# Patient Record
Sex: Female | Born: 1965 | Race: White | Hispanic: No | Smoking: Current every day smoker
Health system: Southern US, Community
[De-identification: ages and names within clinical notes are randomized; demographics above are authoritative.]

## PROBLEM LIST (undated history)

## (undated) DIAGNOSIS — C801 Malignant (primary) neoplasm, unspecified: Secondary | ICD-10-CM

## (undated) HISTORY — PX: TONSILLECTOMY: SUR1361

## (undated) HISTORY — PX: BREAST LUMPECTOMY: SHX2

## (undated) HISTORY — PX: ABDOMINAL SURGERY: SHX537

## (undated) HISTORY — PX: APPENDECTOMY: SHX54

## (undated) HISTORY — PX: CERVICAL FUSION: SHX112

---

## 2008-01-03 ENCOUNTER — Emergency Department (HOSPITAL_COMMUNITY): Admission: EM | Admit: 2008-01-03 | Discharge: 2008-01-04 | Payer: Self-pay | Admitting: Family Medicine

## 2008-01-16 ENCOUNTER — Emergency Department (HOSPITAL_COMMUNITY): Admission: EM | Admit: 2008-01-16 | Discharge: 2008-01-16 | Payer: Self-pay | Admitting: Emergency Medicine

## 2008-02-09 ENCOUNTER — Ambulatory Visit (HOSPITAL_COMMUNITY): Admission: RE | Admit: 2008-02-09 | Discharge: 2008-02-09 | Payer: Self-pay | Admitting: Family Medicine

## 2008-04-26 ENCOUNTER — Emergency Department (HOSPITAL_COMMUNITY): Admission: EM | Admit: 2008-04-26 | Discharge: 2008-04-26 | Payer: Self-pay | Admitting: Emergency Medicine

## 2008-05-15 ENCOUNTER — Emergency Department (HOSPITAL_COMMUNITY): Admission: EM | Admit: 2008-05-15 | Discharge: 2008-05-15 | Payer: Self-pay | Admitting: Emergency Medicine

## 2008-05-18 ENCOUNTER — Ambulatory Visit (HOSPITAL_COMMUNITY): Admission: RE | Admit: 2008-05-18 | Discharge: 2008-05-18 | Payer: Self-pay | Admitting: Emergency Medicine

## 2008-06-01 ENCOUNTER — Ambulatory Visit (HOSPITAL_COMMUNITY): Admission: RE | Admit: 2008-06-01 | Discharge: 2008-06-01 | Payer: Self-pay | Admitting: Sports Medicine

## 2009-08-13 IMAGING — CR DG HIP COMPLETE 2+V*R*
3 series · 3 of 3 positions shown · non-contrast
Comparison: None

CLINICAL DATA: Right hip pain

RIGHT HIP - COMPLETE 2+ VIEW

[view not recorded (1 of 3)]
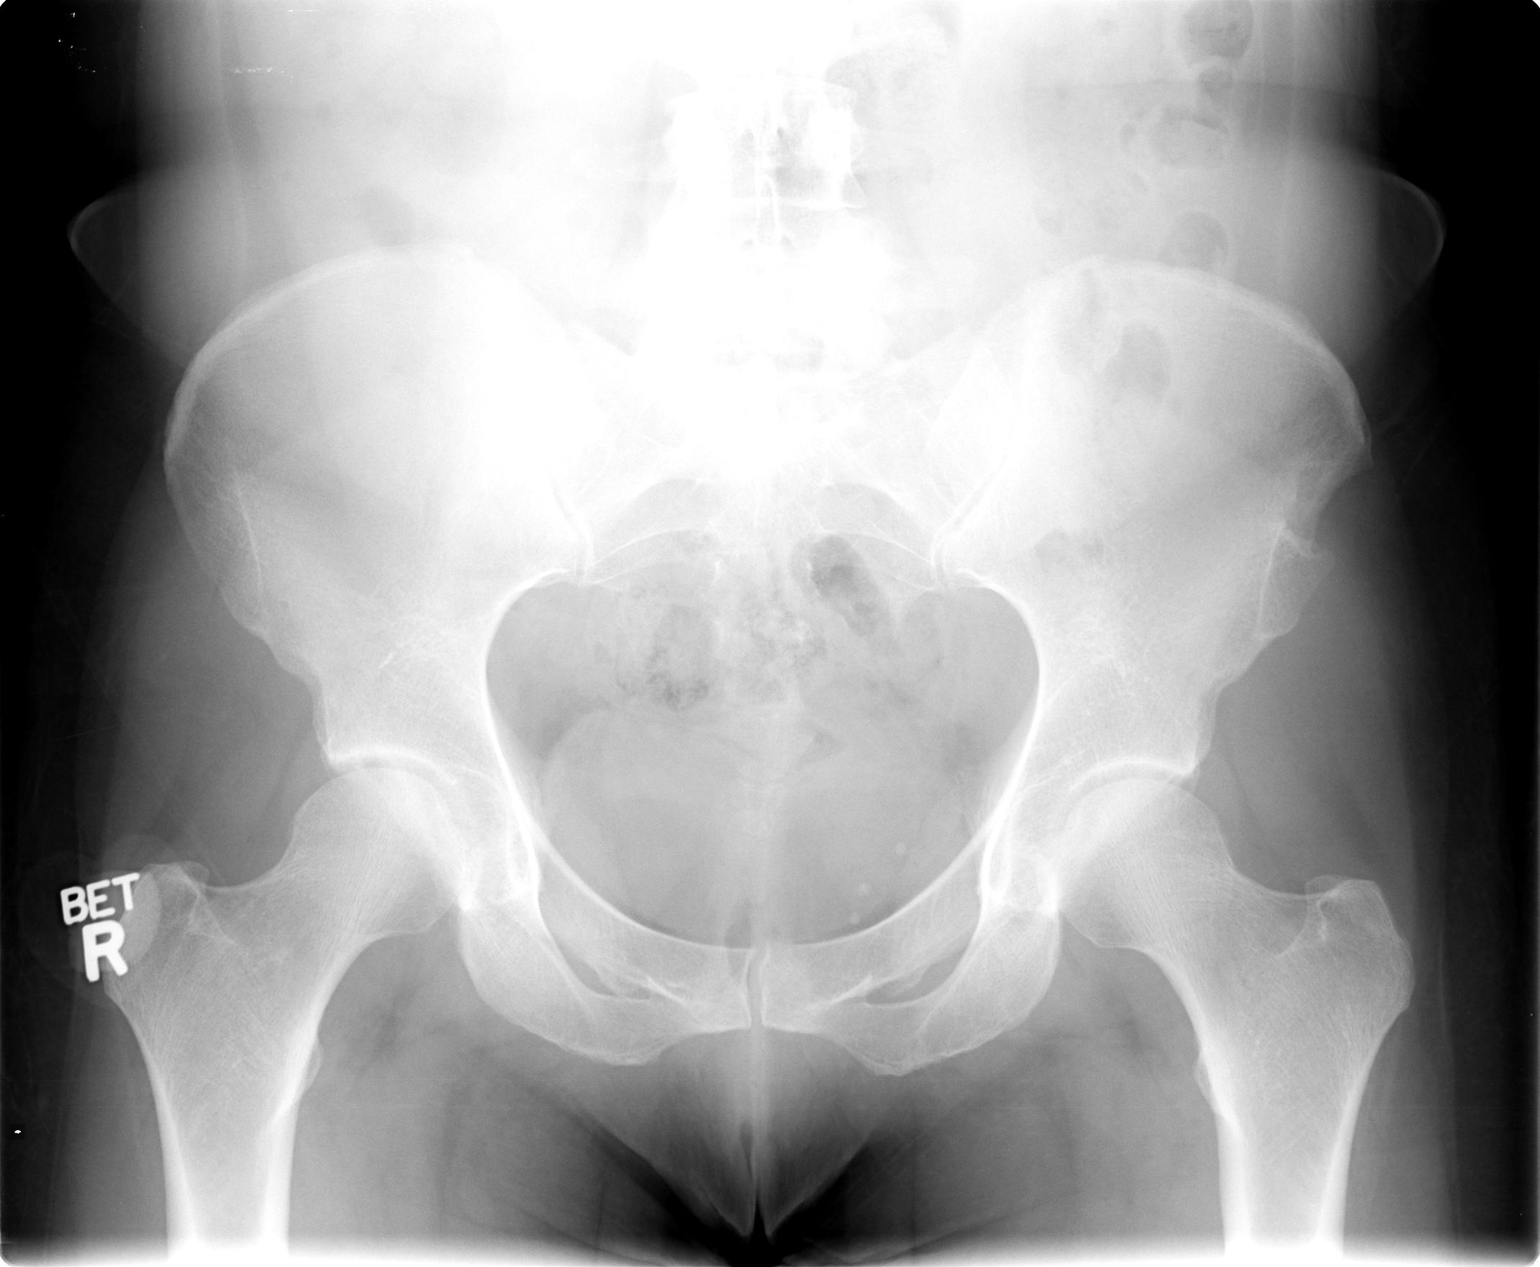

[view not recorded (2 of 3)]
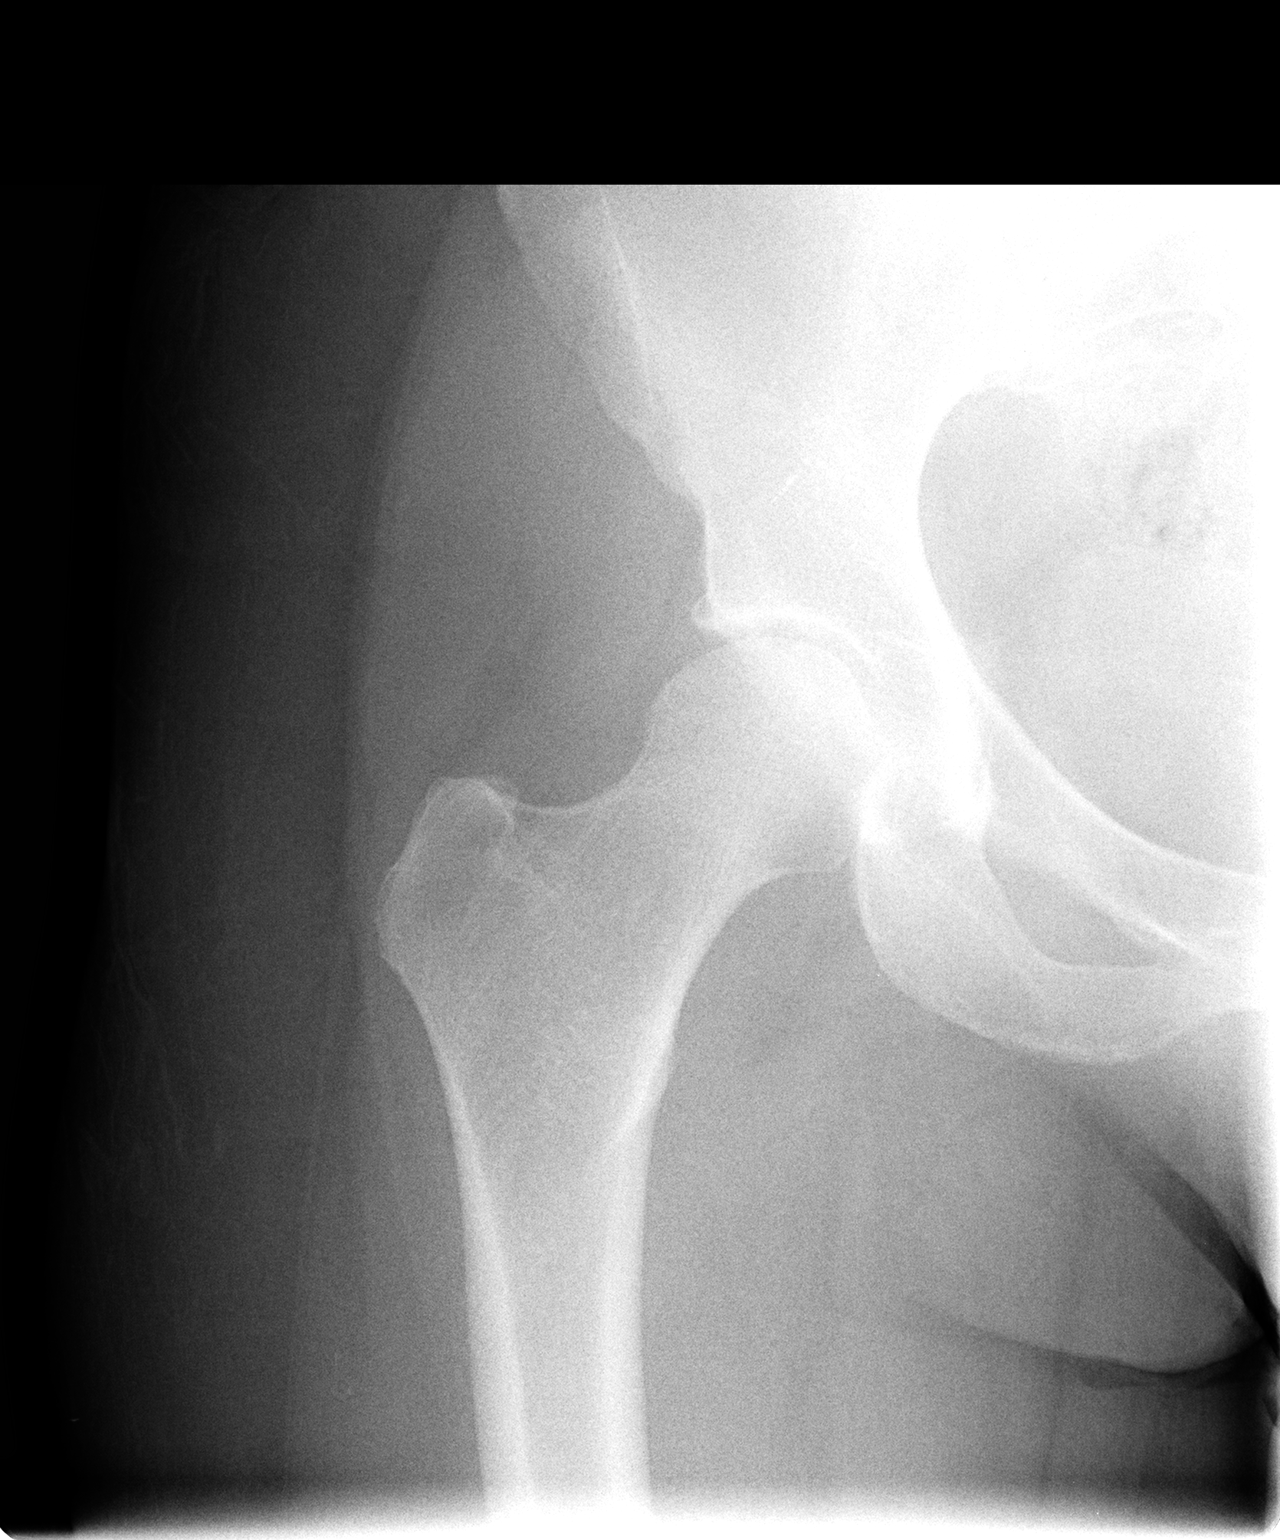

[view not recorded (3 of 3)]
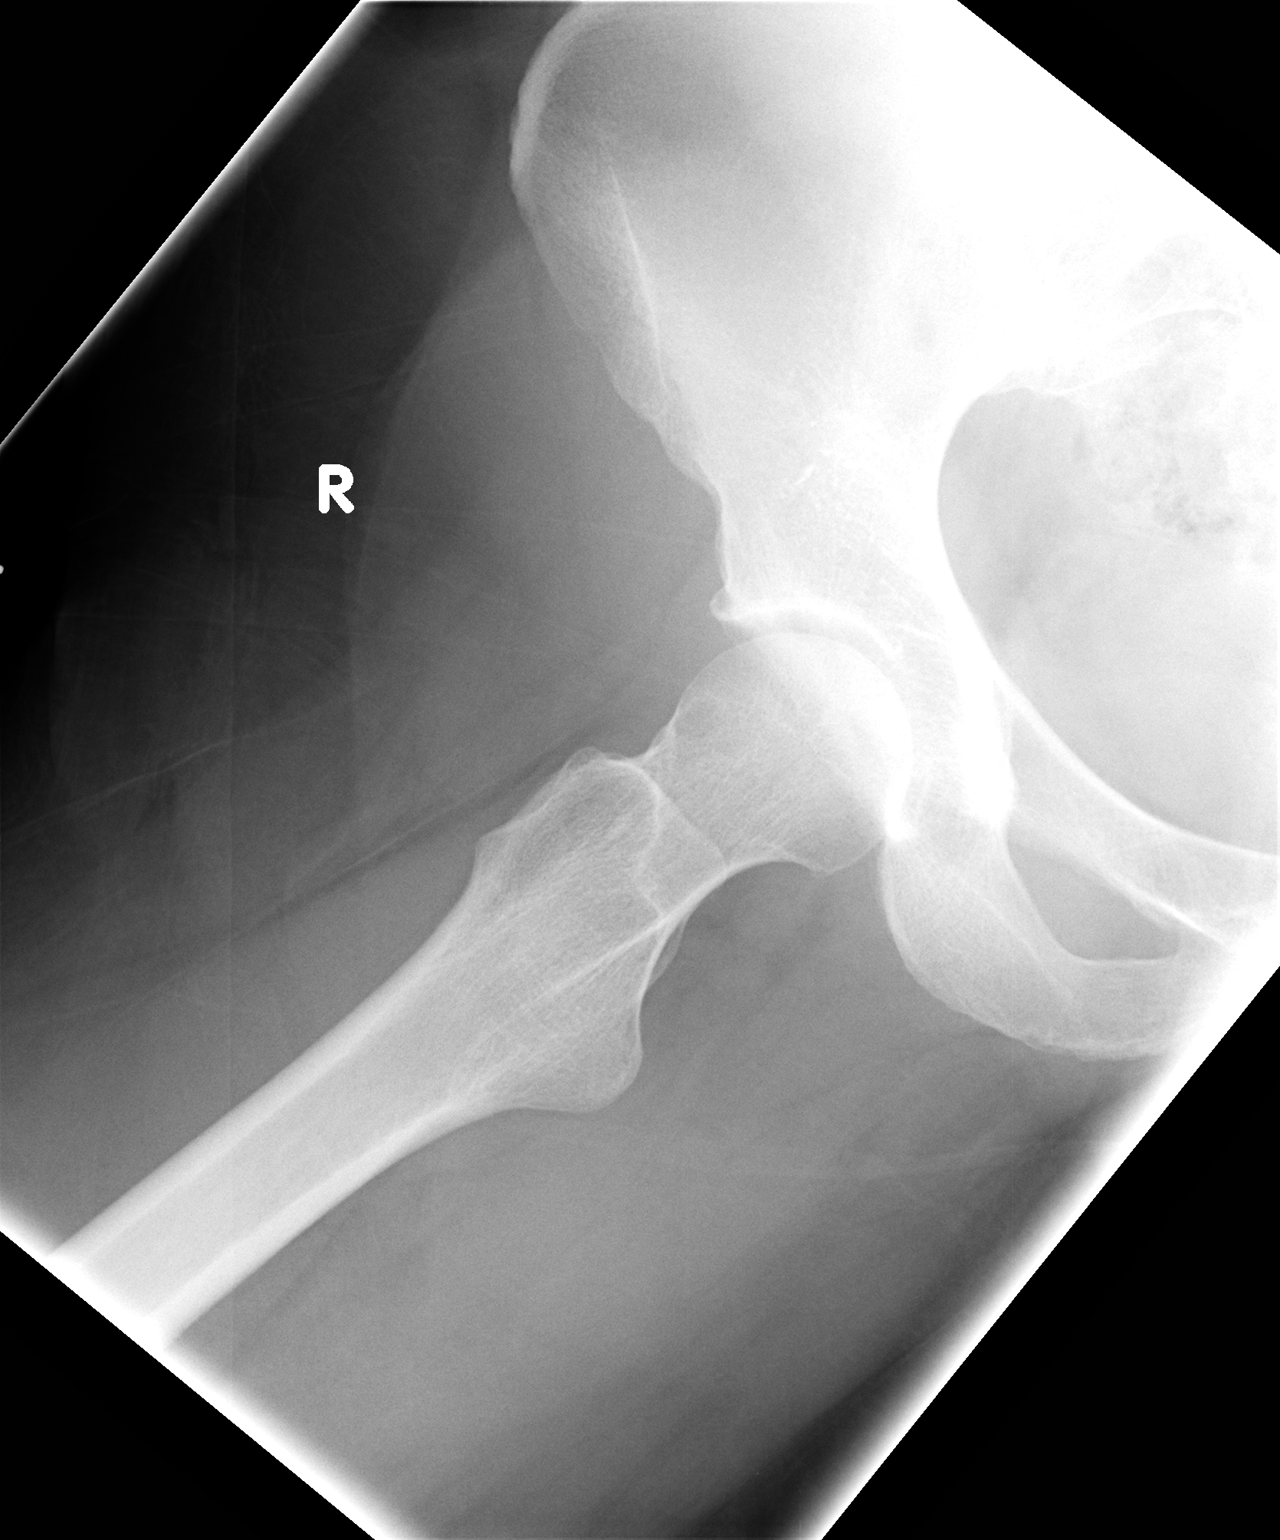

[3 of 3 positions shown; findings below may reference images not displayed]

FINDINGS: Hip joint spaces symmetric and preserved.
SI joints symmetric.
No fracture, dislocation, or bone destruction.
Small left pelvic phleboliths.
IMPRESSION: No acute bony abnormalities.

## 2009-09-04 ENCOUNTER — Emergency Department: Payer: Self-pay | Admitting: Emergency Medicine

## 2009-09-11 ENCOUNTER — Ambulatory Visit: Payer: Self-pay | Admitting: Internal Medicine

## 2010-04-13 ENCOUNTER — Encounter: Payer: Self-pay | Admitting: Emergency Medicine

## 2010-06-29 ENCOUNTER — Emergency Department: Payer: Self-pay | Admitting: Emergency Medicine

## 2010-07-08 LAB — BASIC METABOLIC PANEL
CO2: 25 mEq/L (ref 19–32)
Creatinine, Ser: 0.69 mg/dL (ref 0.4–1.2)
Glucose, Bld: 93 mg/dL (ref 70–99)
Sodium: 141 mEq/L (ref 135–145)

## 2010-07-08 LAB — DIFFERENTIAL
Basophils Absolute: 0 10*3/uL (ref 0.0–0.1)
Basophils Relative: 0 % (ref 0–1)
Eosinophils Absolute: 0.2 10*3/uL (ref 0.0–0.7)
Eosinophils Absolute: 0.1 10*3/uL (ref 0.0–0.7)
Eosinophils Relative: 4 % (ref 0–5)
Eosinophils Relative: 2 % (ref 0–5)
Lymphs Abs: 2 10*3/uL (ref 0.7–4.0)
Monocytes Absolute: 0.8 10*3/uL (ref 0.1–1.0)
Monocytes Absolute: 0.6 10*3/uL (ref 0.1–1.0)
Neutro Abs: 3.5 10*3/uL (ref 1.7–7.7)
Neutrophils Relative %: 46 % (ref 43–77)
Neutrophils Relative %: 56 % (ref 43–77)

## 2010-07-08 LAB — CBC
HCT: 46.8 % — ABNORMAL HIGH (ref 36.0–46.0)
HCT: 41.3 % (ref 36.0–46.0)
Hemoglobin: 16.5 g/dL — ABNORMAL HIGH (ref 12.0–15.0)
Hemoglobin: 14.8 g/dL (ref 12.0–15.0)
MCHC: 35.1 g/dL (ref 30.0–36.0)
MCV: 93.5 fL (ref 78.0–100.0)
RBC: 4.42 MIL/uL (ref 3.87–5.11)
RDW: 12.6 % (ref 11.5–15.5)
WBC: 5.1 10*3/uL (ref 4.0–10.5)
WBC: 6.3 10*3/uL (ref 4.0–10.5)

## 2010-07-08 LAB — COMPREHENSIVE METABOLIC PANEL
AST: 21 U/L (ref 0–37)
BUN: 13 mg/dL (ref 6–23)
CO2: 25 mEq/L (ref 19–32)
Calcium: 8.9 mg/dL (ref 8.4–10.5)
GFR calc non Af Amer: >60 mL/min (ref >60)
Total Bilirubin: 0.6 mg/dL (ref 0.3–1.2)

## 2011-12-07 ENCOUNTER — Ambulatory Visit: Payer: Self-pay | Admitting: Family Medicine

## 2014-06-13 ENCOUNTER — Emergency Department: Payer: Self-pay | Admitting: Student

## 2014-10-21 ENCOUNTER — Encounter: Payer: Self-pay | Admitting: *Deleted

## 2014-10-21 DIAGNOSIS — Z91040 Latex allergy status: Secondary | ICD-10-CM | POA: Insufficient documentation

## 2014-10-21 DIAGNOSIS — G8929 Other chronic pain: Secondary | ICD-10-CM | POA: Insufficient documentation

## 2014-10-21 DIAGNOSIS — H538 Other visual disturbances: Secondary | ICD-10-CM | POA: Diagnosis not present

## 2014-10-21 DIAGNOSIS — Z720 Tobacco use: Secondary | ICD-10-CM | POA: Diagnosis not present

## 2014-10-21 DIAGNOSIS — G911 Obstructive hydrocephalus: Secondary | ICD-10-CM | POA: Insufficient documentation

## 2014-10-21 DIAGNOSIS — R51 Headache: Secondary | ICD-10-CM | POA: Diagnosis present

## 2014-10-21 DIAGNOSIS — G9389 Other specified disorders of brain: Secondary | ICD-10-CM | POA: Diagnosis not present

## 2014-10-21 DIAGNOSIS — R0602 Shortness of breath: Secondary | ICD-10-CM | POA: Insufficient documentation

## 2014-10-21 NOTE — ED Notes (Signed)
Pt c/o headache x 1 month, worsening over the course of time. Pt states pain getting progressively worse. Pt states movement and coughing make pain worse. Pt c/o nausea, denies vomiting. Pt states she has had visual changes x 10 days ago. Pt states fioricet and excedrin migraine no longer working to decrease pain.

## 2014-10-22 ENCOUNTER — Emergency Department: Payer: Medicaid Other

## 2014-10-22 ENCOUNTER — Emergency Department
Admission: EM | Admit: 2014-10-22 | Discharge: 2014-10-22 | Disposition: A | Discharge status: Other Acute Inpatient Hospital | Payer: Medicaid Other | Attending: Emergency Medicine | Admitting: Emergency Medicine

## 2014-10-22 DIAGNOSIS — R519 Headache, unspecified: Secondary | ICD-10-CM

## 2014-10-22 DIAGNOSIS — G911 Obstructive hydrocephalus: Secondary | ICD-10-CM

## 2014-10-22 DIAGNOSIS — G9389 Other specified disorders of brain: Secondary | ICD-10-CM

## 2014-10-22 DIAGNOSIS — R51 Headache: Secondary | ICD-10-CM

## 2014-10-22 HISTORY — DX: Malignant (primary) neoplasm, unspecified: C80.1

## 2014-10-22 LAB — BASIC METABOLIC PANEL
Anion gap: 7 (ref 5–15)
BUN: 14 mg/dL (ref 6–20)
CO2: 28 mmol/L (ref 22–32)
Calcium: 9.5 mg/dL (ref 8.9–10.3)
Chloride: 104 mmol/L (ref 101–111)
Creatinine, Ser: 0.65 mg/dL (ref 0.44–1.00)
GFR calc non Af Amer: >60 mL/min (ref >60)
GLUCOSE: 108 mg/dL — AB (ref 65–99)
Potassium: 3.4 mmol/L — ABNORMAL LOW (ref 3.5–5.1)
Sodium: 139 mmol/L (ref 135–145)

## 2014-10-22 LAB — CBC
HCT: 46.1 % (ref 35.0–47.0)
HEMOGLOBIN: 15.8 g/dL (ref 12.0–16.0)
MCH: 33.3 pg (ref 26.0–34.0)
MCHC: 34.3 g/dL (ref 32.0–36.0)
MCV: 97 fL (ref 80.0–100.0)
PLATELETS: 298 10*3/uL (ref 150–440)
RBC: 4.75 MIL/uL (ref 3.80–5.20)
RDW: 12.5 % (ref 11.5–14.5)
WBC: 8.6 10*3/uL (ref 3.6–11.0)

## 2014-10-22 MED ORDER — DEXAMETHASONE SODIUM PHOSPHATE 10 MG/ML IJ SOLN
INTRAMUSCULAR | Status: AC
  Administered 2014-10-22: 10 mg via INTRAVENOUS
  Filled 2014-10-22: qty 1

## 2014-10-22 MED ORDER — SODIUM CHLORIDE 0.9 % IV BOLUS (SEPSIS)
1000.0000 mL | Freq: Once | INTRAVENOUS | Status: AC
  Administered 2014-10-22: 1000 mL via INTRAVENOUS

## 2014-10-22 MED ORDER — DIPHENHYDRAMINE HCL 50 MG/ML IJ SOLN
25.0000 mg | Freq: Once | INTRAMUSCULAR | Status: DC
  Filled 2014-10-22: qty 1

## 2014-10-22 MED ORDER — METOCLOPRAMIDE HCL 5 MG/ML IJ SOLN
10.0000 mg | Freq: Once | INTRAMUSCULAR | Status: AC
  Administered 2014-10-22: 10 mg via INTRAVENOUS
  Filled 2014-10-22: qty 2

## 2014-10-22 MED ORDER — DEXAMETHASONE SODIUM PHOSPHATE 10 MG/ML IJ SOLN
10.0000 mg | Freq: Once | INTRAMUSCULAR | Status: AC
  Administered 2014-10-22: 10 mg via INTRAVENOUS

## 2014-10-22 MED ORDER — METHYLPREDNISOLONE SODIUM SUCC 125 MG IJ SOLR
125.0000 mg | Freq: Once | INTRAMUSCULAR | Status: DC
  Filled 2014-10-22: qty 2

## 2014-10-22 NOTE — ED Notes (Signed)
Pt left via EMS to be transferred to [facility]

## 2014-10-22 NOTE — ED Notes (Signed)
Pt flushed after dexamethosone "it's burning everywhere" Dr Dahlia Client informed, benadryl ordered but pt refused d/t symptoms subsiding

## 2014-10-22 NOTE — ED Provider Notes (Signed)
[facility] Emergency Department Provider Note  ____________________________________________  Time seen: Approximately 158 AM  I have reviewed the triage vital signs and the nursing notes.   HISTORY  Chief Complaint Headache    HPI Elizabeth Bright is a 49 y.o. female who come into the hospital tonight with a headache for 1 month. The patient reports that she has been to her primary care physician 3 times in the past month. The patient's physician has been attempting to get the patient an MRI but has been refused by her insurance company. The patient reports that she has been taking Excedrin to help but the pain keeps coming back. She reports that the headache is worse when she coughs or moves her head or bends over. The patient reports that it is there all of the time. She has had some blurred vision and reports that recently she has been feeling as though she is drunk. The patient has not been referred to a neurologist and has not had any other imaging of her head. The patient reports that she's never had headaches before. She has been feeling nauseous and dizzy as well. She's had some mild shortness of breath. Her headache is a 7-8 out of 10 in intensity. The patient reports that she came in tonight because she is scared.   Past Medical History  Diagnosis Date  . Cancer     There are no active problems to display for this patient.   Past Surgical History  Procedure Laterality Date  . Breast lumpectomy Right   . Abdominal surgery    . Cervical fusion      C5-6  . Tonsillectomy    . Appendectomy      Current Outpatient Rx  Name  Route  Sig  Dispense  Refill  . albuterol (PROVENTIL HFA;VENTOLIN HFA) 108 (90 BASE) MCG/ACT inhaler   Inhalation   Inhale 2 puffs into the lungs every 6 (six) hours as needed for wheezing or shortness of breath.         . butalbital-acetaminophen-caffeine (FIORICET, ESGIC) 50-325-40 MG per tablet   Oral   Take 1 tablet by  mouth every 6 (six) hours as needed for headache.           Allergies Latex  History reviewed. No pertinent family history.  Social History History  Substance Use Topics  . Smoking status: Current Every Day Smoker    Types: Cigarettes  . Smokeless tobacco: Never Used  . Alcohol Use: Yes     Comment: occasional beer    Review of Systems Constitutional: No fever/chills Eyes: Blurred vision ENT: No sore throat. Cardiovascular: Denies chest pain. Respiratory: Shortness of breath Gastrointestinal: Nausea with No abdominal pain, no vomiting.  No diarrhea.  No constipation. Genitourinary: Negative for dysuria. Musculoskeletal: Negative for back pain. Skin: Negative for rash. Neurological: Headache  10-point ROS otherwise negative.  ____________________________________________   PHYSICAL EXAM:  VITAL SIGNS: ED Triage Vitals  Enc Vitals Group     BP 10/21/14 2139 144/95 mmHg     Pulse Rate 10/21/14 2139 94     Resp 10/21/14 2139 20     Temp 10/21/14 2139 98 F (36.7 C)     Temp Source 10/21/14 2139 Oral     SpO2 10/21/14 2139 96 %     Weight 10/22/14 0130 123 lb (55.792 kg)     Height 10/22/14 0130 5\' 1"  (1.549 m)     Head Cir --      Peak Flow --  Pain Score 10/21/14 2140 10     Pain Loc --      Pain Edu? --      Excl. in New Home? --     Constitutional: Alert and oriented. Well appearing and in mild distress. Eyes: Conjunctivae are normal. PERRL. EOMI. horizontal nystagmus Head: Atraumatic. Nose: No congestion/rhinnorhea. Mouth/Throat: Mucous membranes are moist.  Oropharynx non-erythematous. Cardiovascular: Normal rate, regular rhythm. Grossly normal heart sounds.  Good peripheral circulation. Respiratory: Normal respiratory effort.  No retractions. Lungs CTAB. Gastrointestinal: Soft and nontender. No distention. Positive bowel sounds Genitourinary: Deferred Musculoskeletal: No lower extremity tenderness nor edema.  No joint effusions. Neurologic:  Normal  speech and language. No gross focal neurologic deficits are appreciated. No gait instability. Skin:  Skin is warm, dry and intact. No rash noted. Psychiatric: Mood and affect are normal.   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 MRI without contrast is recommended. There may be a second mass in the pineal region ____________________________________________   PROCEDURES  Procedure(s) performed: None  Critical Care performed: No  ____________________________________________   INITIAL IMPRESSION / ASSESSMENT AND PLAN / ED COURSE  Pertinent labs & imaging results that were available during my care of the patient were reviewed by me and considered in my medical decision making (see chart for details).  This is a 49 year old female who comes in today with a headache for one month. The patient's headache is likely due to the mass found on the CT scan. The patient does have a history of breast cancer so there is a concern that this may be metastatic. I did give the patient a dose of Reglan for nausea as well as a dose of Decadron 10 mg IV for hydrocephalus. The patient will be transferred to [facility] since she does have some obstructive hydrocephalus seen on the CT scan. I discussed this with the patient and her significant other. The patient was very emotionally upset but I informed her that she will find out more information about this mass at  [facility].  I contacted the [facility] transfer center and the patient was accepted in transfer to [facility] for further evaluation and treatment of her headache and hydrocephalus due to a cerebellar mass. ____________________________________________   FINAL CLINICAL IMPRESSION(S) / ED DIAGNOSES  Final diagnoses:  Cerebellar mass  Obstructive hydrocephalus  Chronic intractable headache, unspecified headache type      Loney Hering, MD 10/22/14 [phone]

## 2014-10-22 NOTE — ED Notes (Signed)
Pt states HA above eyes for last month; worse with movement, light or straining on toilet.  Pt has hx breast CA (2008) with chemo and Rad, and recent new lump right breast.  Pt anxious about insurance coverage.

## 2014-10-22 NOTE — Progress Notes (Addendum)
   10/22/14 0300  Clinical Encounter Type  Visited With Patient and family together  Visit Type Spiritual support  Referral From Nurse  Spiritual Encounters  Spiritual Needs Grief support  Stress Factors  Patient Stress Factors Health changes  Family Stress Factors None identified   Status: grieving, 49 yr female Family: Fiance bedside, the patient shared that he is sometimes supportive Faith: Nondenom Visit Assessment: The chaplain gave encouraging words to the patient and her fiance and sat with the patient until the fiance returned from coffee break. The patient shared that she'll be transf to [facility] after learning that she has a mass on her brain. She shared that her sister just passed and that she does not have children. She confessed that she is afraid and does not want to die. The patient's grief, healing and comfort, will be lifted up in prayer. The visit concluded with a hug.  Pastoral care: [phone] pager or orders can be submitted online

## 2016-02-21 DEATH — deceased

## 2016-05-19 IMAGING — CT CT HEAD W/O CM
1 series · 15 of 29 positions shown, 19 images · non-contrast
Comparison: None.

CLINICAL DATA: Frontal headache for 1 month.  Photophobia.

EXAM:
CT HEAD WITHOUT CONTRAST
TECHNIQUE: Contiguous axial images were obtained from the base of the skull
through the vertex without intravenous contrast.

[Series 2: soft tissue · axial · 0.38mm/px · z∈[-86,+44]mm · 15 of 29 slices shown, 19 images]
[im 2/29  brain]
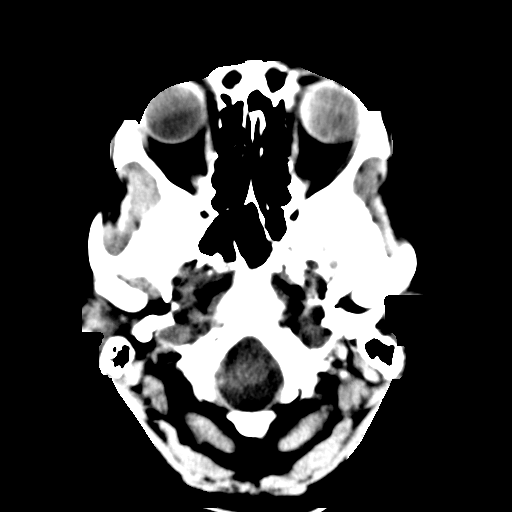
[im 2/29  bone]
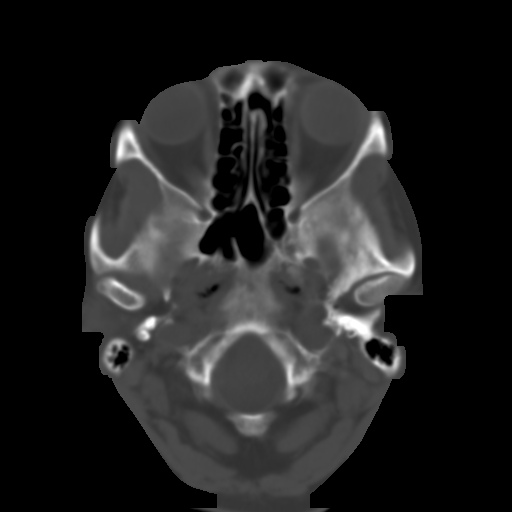
[im 4/29  brain]
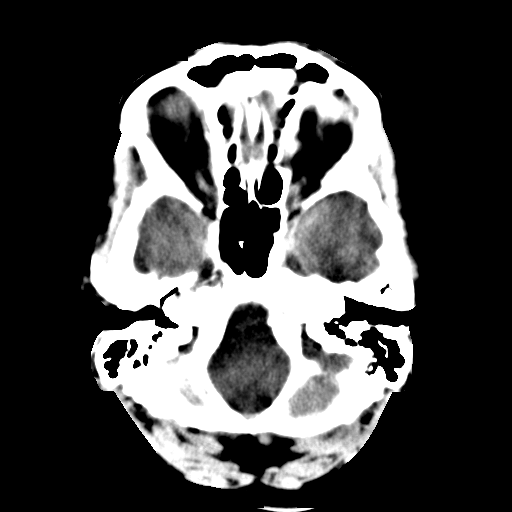
[im 6/29  brain]
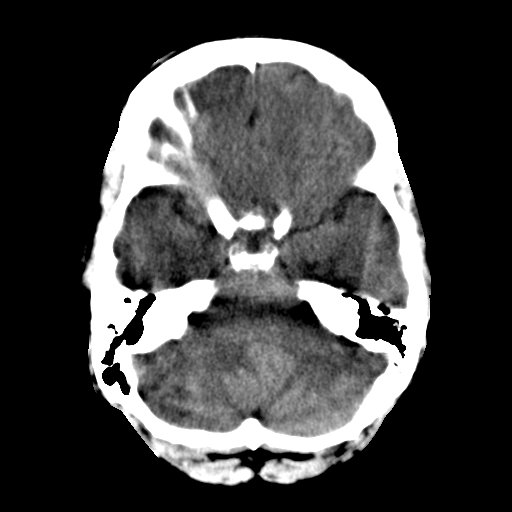
[im 8/29  brain]
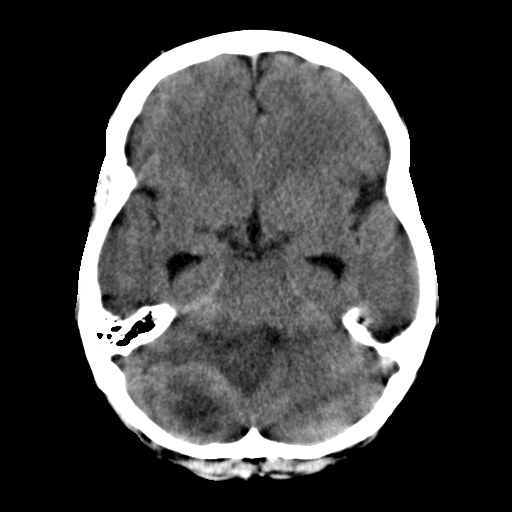
[im 10/29  brain]
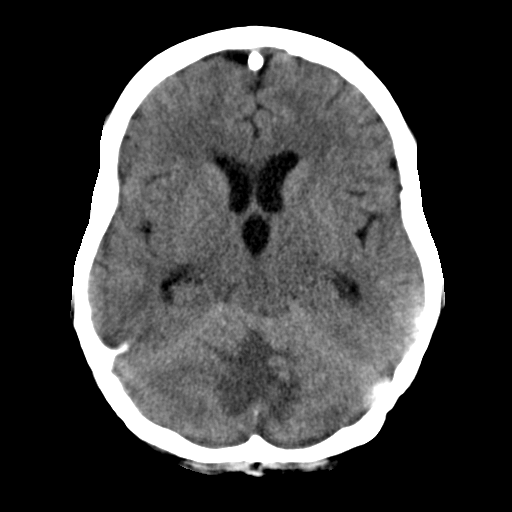
[im 10/29  bone]
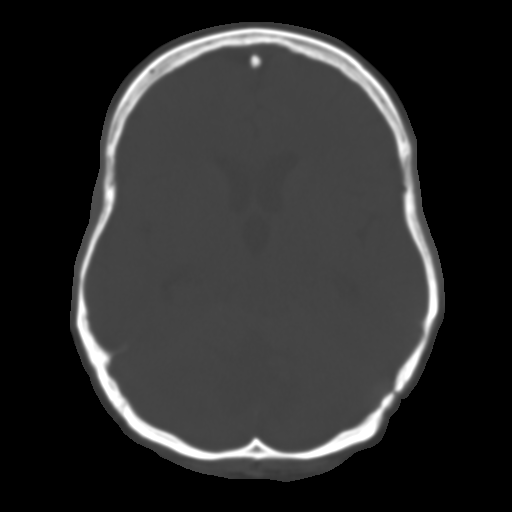
[im 11/29  brain]
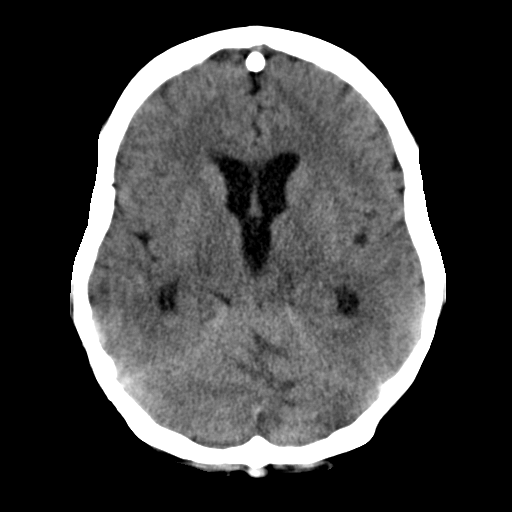
[im 13/29  brain]
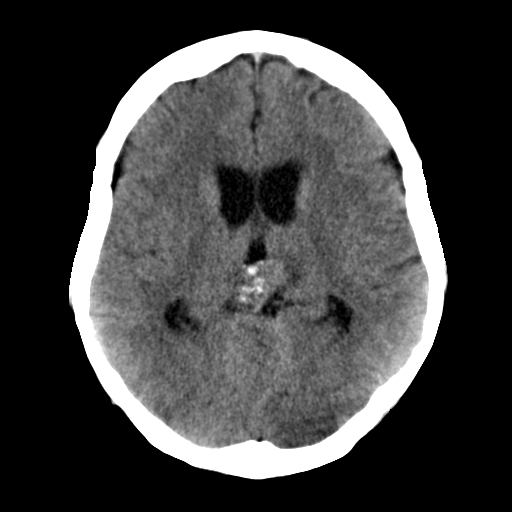
[im 15/29  brain]
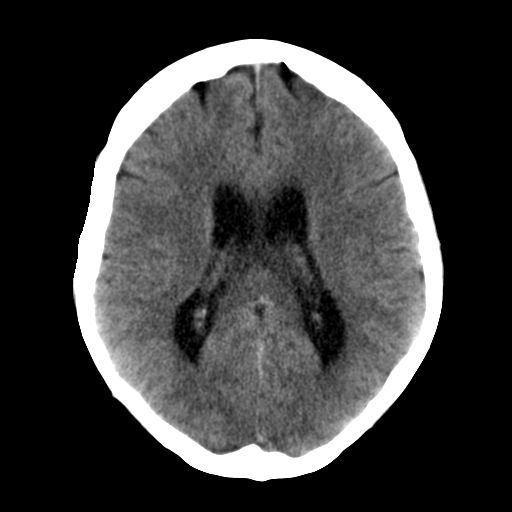
[im 17/29  brain]
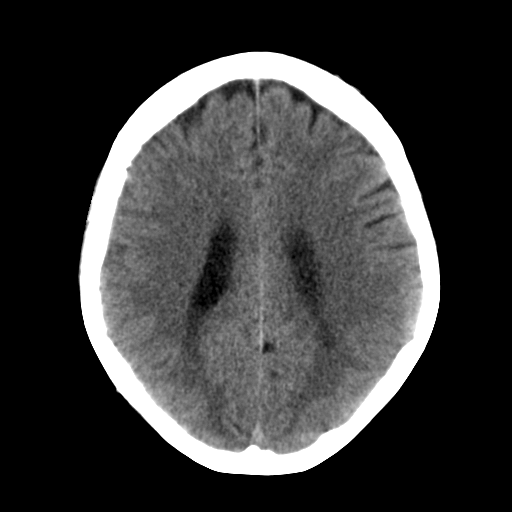
[im 17/29  bone]
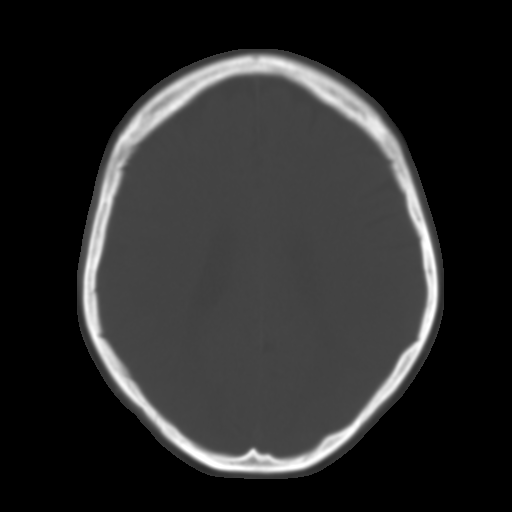
[im 19/29  brain]
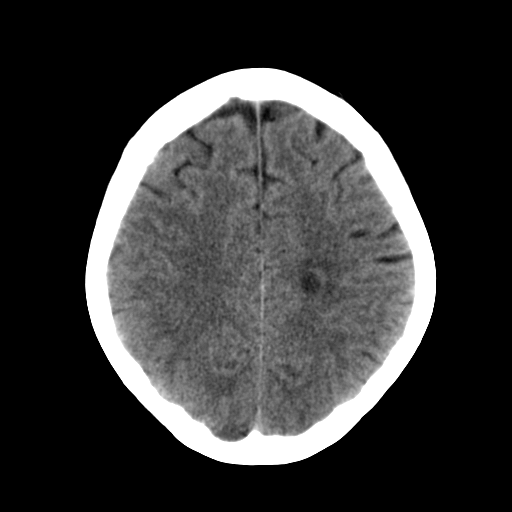
[im 20/29  brain]
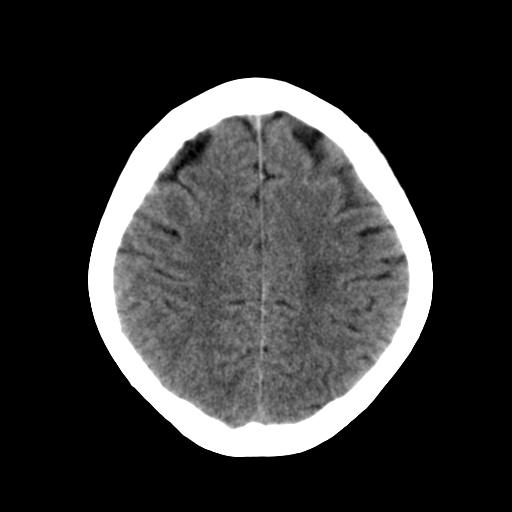
[im 22/29  brain]
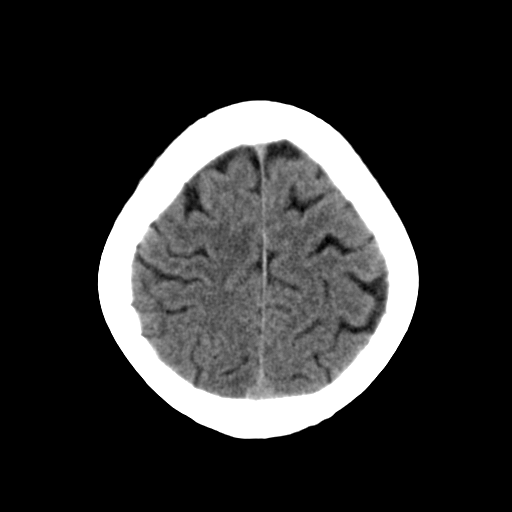
[im 24/29  brain]
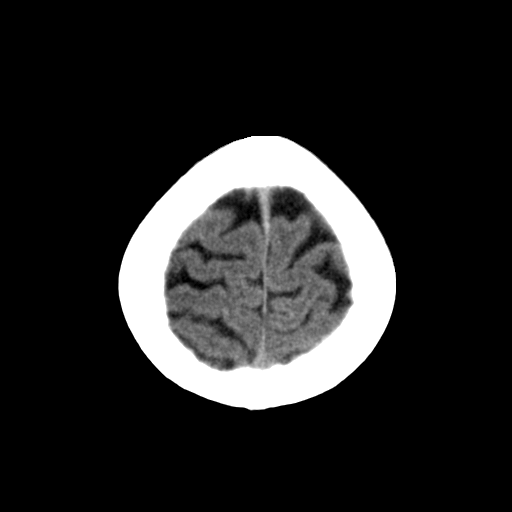
[im 24/29  bone]
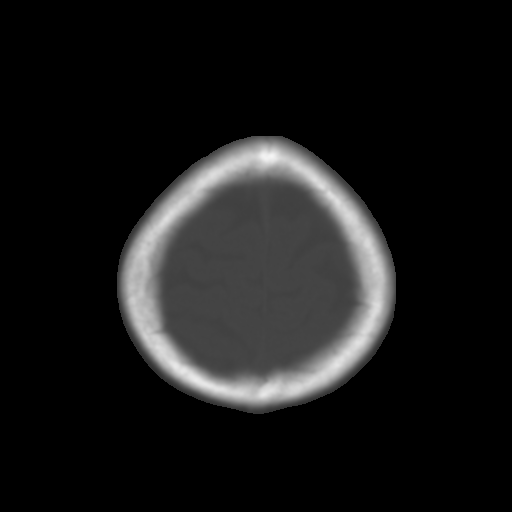
[im 26/29  brain]
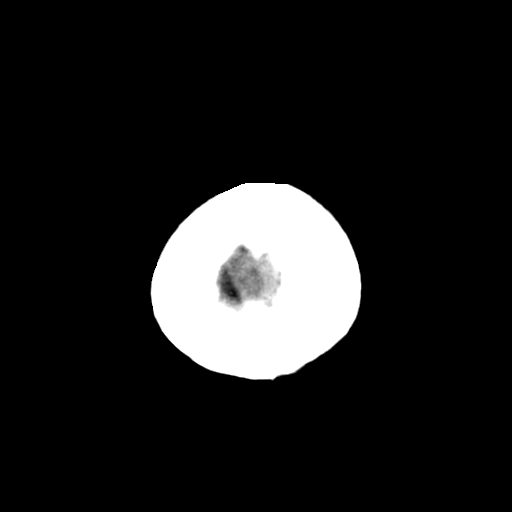
[im 28/29  brain]
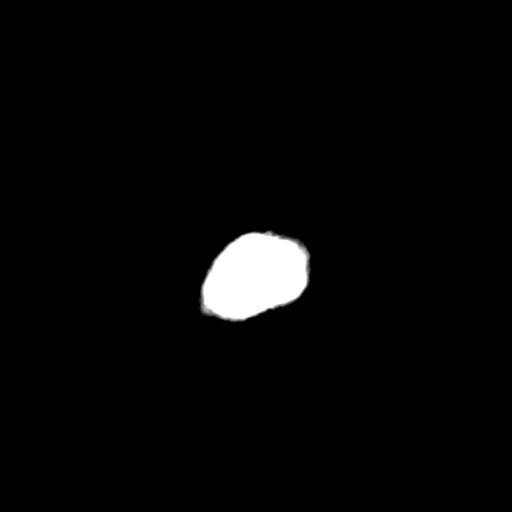

[15 of 29 positions shown; findings below may reference images not displayed]

FINDINGS: There is a 2.9 x 3.7 cm mass in the right cerebellar hemisphere.
There is associated vasogenic edema. There is compression of the
fourth ventricle and aqueduct. There is right to left midline shift
in the posterior fossa.

There is dilatation of the third and lateral ventricles.

There is no intracranial hemorrhage. There is no extra-axial fluid
collection. There is no evidence of acute infarction.

There may be a mass in the perineal region.

No bony abnormality is evident.
IMPRESSION: Right cerebellar hemisphere mass, likely a primary or metastatic
neoplasm. There is obstructive hydrocephalus due to compression of
the fourth ventricle and aqueduct. MRI without with contrast is
recommended. There may be a second mass in the pineal region. These
results were called by telephone at the time of interpretation on
10/22/2014 at [DATE] to Dr. CUC AYAD , who verbally
acknowledged these results.
